# Patient Record
Sex: Male | Born: 1980 | Race: White | Hispanic: No | Marital: Single | Smoking: Current every day smoker
Health system: Southern US, Community
[De-identification: ages and names within clinical notes are randomized; demographics above are authoritative.]

## PROBLEM LIST (undated history)

## (undated) HISTORY — PX: OTHER SURGICAL HISTORY: SHX169

---
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
## 2002-04-15 ENCOUNTER — Inpatient Hospital Stay (HOSPITAL_COMMUNITY): Admission: EM | Admit: 2002-04-15 | Discharge: 2002-04-20 | Payer: Self-pay | Admitting: Psychiatry

## 2010-03-03 ENCOUNTER — Emergency Department (HOSPITAL_COMMUNITY): Admission: EM | Admit: 2010-03-03 | Discharge: 2010-03-04 | Payer: Self-pay | Admitting: Emergency Medicine
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
## 2010-12-31 ENCOUNTER — Emergency Department (HOSPITAL_COMMUNITY)
Admission: EM | Admit: 2010-12-31 | Discharge: 2011-01-01 | Disposition: A | Discharge status: Other Acute Inpatient Hospital | Payer: PRIVATE HEALTH INSURANCE | Attending: Emergency Medicine | Admitting: Emergency Medicine

## 2010-12-31 DIAGNOSIS — F411 Generalized anxiety disorder: Secondary | ICD-10-CM | POA: Insufficient documentation

## 2010-12-31 DIAGNOSIS — F1020 Alcohol dependence, uncomplicated: Secondary | ICD-10-CM | POA: Insufficient documentation

## 2010-12-31 LAB — DIFFERENTIAL
Basophils Absolute: 0 10*3/uL (ref 0.0–0.1)
Basophils Relative: 0 % (ref 0–1)
Eosinophils Absolute: 0.1 10*3/uL (ref 0.0–0.7)
Neutro Abs: 10.7 10*3/uL — ABNORMAL HIGH (ref 1.7–7.7)
Neutrophils Relative %: 66 % (ref 43–77)

## 2010-12-31 LAB — CBC
Hemoglobin: 15.5 g/dL (ref 13.0–17.0)
MCH: 31.3 pg (ref 26.0–34.0)
Platelets: 309 10*3/uL (ref 150–400)
RBC: 4.95 MIL/uL (ref 4.22–5.81)

## 2010-12-31 LAB — URINALYSIS, ROUTINE W REFLEX MICROSCOPIC
Glucose, UA: NEGATIVE mg/dL
Ketones, ur: NEGATIVE mg/dL
Leukocytes, UA: NEGATIVE
Nitrite: NEGATIVE
Protein, ur: NEGATIVE mg/dL
pH: 7 (ref 5.0–8.0)

## 2010-12-31 LAB — RAPID URINE DRUG SCREEN, HOSP PERFORMED
Amphetamines: NOT DETECTED
Barbiturates: NOT DETECTED
Benzodiazepines: NOT DETECTED
Cocaine: NOT DETECTED
Tetrahydrocannabinol: POSITIVE — AB

## 2010-12-31 LAB — COMPREHENSIVE METABOLIC PANEL
ALT: 23 U/L (ref 0–53)
AST: 24 U/L (ref 0–37)
Albumin: 4.7 g/dL (ref 3.5–5.2)
CO2: 28 mEq/L (ref 19–32)
Calcium: 10 mg/dL (ref 8.4–10.5)
Chloride: 103 mEq/L (ref 96–112)
Creatinine, Ser: 0.69 mg/dL (ref 0.50–1.35)
Sodium: 141 mEq/L (ref 135–145)

## 2011-01-01 ENCOUNTER — Inpatient Hospital Stay (HOSPITAL_COMMUNITY)
Admission: RE | Admit: 2011-01-01 | Discharge: 2011-01-04 | DRG: 897 | Disposition: A | Discharge status: Home / Self Care | Payer: PRIVATE HEALTH INSURANCE | Attending: Psychiatry | Admitting: Psychiatry

## 2011-01-01 DIAGNOSIS — F3289 Other specified depressive episodes: Secondary | ICD-10-CM

## 2011-01-01 DIAGNOSIS — F329 Major depressive disorder, single episode, unspecified: Secondary | ICD-10-CM

## 2011-01-01 DIAGNOSIS — F1020 Alcohol dependence, uncomplicated: Secondary | ICD-10-CM

## 2011-01-01 DIAGNOSIS — F17200 Nicotine dependence, unspecified, uncomplicated: Secondary | ICD-10-CM

## 2011-01-01 DIAGNOSIS — Z9181 History of falling: Secondary | ICD-10-CM

## 2011-01-01 DIAGNOSIS — F411 Generalized anxiety disorder: Secondary | ICD-10-CM

## 2011-01-01 LAB — ETHANOL: Alcohol, Ethyl (B): <11 mg/dL (ref 0–11)
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
## 2011-04-09 ENCOUNTER — Emergency Department (HOSPITAL_COMMUNITY)
Admission: EM | Admit: 2011-04-09 | Discharge: 2011-04-09 | Disposition: A | Discharge status: Home / Self Care | Payer: Self-pay | Attending: Emergency Medicine | Admitting: Emergency Medicine

## 2011-04-09 ENCOUNTER — Encounter: Payer: Self-pay | Admitting: *Deleted

## 2011-04-09 DIAGNOSIS — IMO0002 Reserved for concepts with insufficient information to code with codable children: Secondary | ICD-10-CM | POA: Insufficient documentation

## 2011-04-09 DIAGNOSIS — Y92009 Unspecified place in unspecified non-institutional (private) residence as the place of occurrence of the external cause: Secondary | ICD-10-CM | POA: Insufficient documentation

## 2011-04-09 DIAGNOSIS — S0081XA Abrasion of other part of head, initial encounter: Secondary | ICD-10-CM

## 2011-04-09 DIAGNOSIS — W208XXA Other cause of strike by thrown, projected or falling object, initial encounter: Secondary | ICD-10-CM | POA: Insufficient documentation

## 2011-04-09 MED ORDER — ACETAMINOPHEN 325 MG PO TABS
650.0000 mg | ORAL_TABLET | Freq: Once | ORAL | Status: AC
  Administered 2011-04-09: 650 mg via ORAL
  Filled 2011-04-09: qty 2
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
## 2011-04-09 NOTE — ED Provider Notes (Signed)
History     CSN: [id number] Arrival date & time: 04/09/2011  4:21 PM   First MD Initiated Contact with Patient 04/09/11 1644      Chief Complaint  Patient presents with  . Facial Laceration    (Consider location/radiation/quality/duration/timing/severity/associated sxs/prior treatment) Patient is a 30 y.o. male presenting with scalp laceration. The history is provided by the patient.  Head Laceration This is a new problem. The current episode started today.  pt presents to the ED for having had a car stereo fall on him after it fell out of his closet. He states that it does not hurt that badly,however he is concerned about the germs and wants to make sure it is cleaned well. He denies and headaches. Change in vision, severe pain or imbalance.  History reviewed. No pertinent past medical history.  Past Surgical History  Procedure Date  . Arm surgery     History reviewed. No pertinent family history.  History  Substance Use Topics  . Smoking status: Current Everyday Smoker -- 1.0 packs/day for 5 years    Types: Cigarettes  . Smokeless tobacco: Not on file  . Alcohol Use: No      Review of Systems  All other systems reviewed and are negative.    Allergies  Review of patient's allergies indicates no known allergies.  Home Medications  No current outpatient prescriptions on file.  BP 131/82  Pulse 82  Temp(Src) 98.2 F (36.8 C) (Oral)  Resp 20  Ht 5' 10.5" (1.791 m)  Wt 145 lb (65.772 kg)  BMI 20.51 kg/m2  SpO2 99%  Physical Exam  Vitals reviewed. Constitutional: He is oriented to person, place, and time. He appears well-developed and well-nourished.  HENT:  Head: Normocephalic.    Eyes: Conjunctivae are normal. Pupils are equal, round, and reactive to light.  Neck: Normal range of motion. Neck supple.  Cardiovascular: Normal rate and regular rhythm.   Pulmonary/Chest: Effort normal and breath sounds normal.  Musculoskeletal: Normal range of motion.    Neurological: He is alert and oriented to person, place, and time.  Skin: Skin is warm and dry.    ED Course  LACERATION REPAIR Date/Time: 04/09/2011 4:55 PM Performed by: Dorthula Matas Authorized by: Dorthula Matas Consent: Verbal consent obtained. Written consent obtained. Risks and benefits: risks, benefits and alternatives were discussed Patient understanding: patient states understanding of the procedure being performed Patient consent: the patient's understanding of the procedure matches consent given Body area: head/neck Laceration length: 1 cm Foreign bodies: metal Tendon involvement: superficial Nerve involvement: superficial Vascular damage: no Patient sedated: no Amount of cleaning: extensive Debridement: none Degree of undermining: none Skin closure: glue Patient tolerance: Patient tolerated the procedure well with no immediate complications.   (including critical care time)  Labs Reviewed - No data to display No results found.   No diagnosis found.    MDM  No EKG done       Dorthula Matas, [place] 04/10/11 [phone]

## 2011-04-10 NOTE — ED Provider Notes (Signed)
Medical screening examination/treatment/procedure(s) were performed by non-physician practitioner and as supervising physician I was immediately available for consultation/collaboration.   Dayton Bailiff, MD 04/10/11 [phone]

## 2012-01-04 ENCOUNTER — Encounter (HOSPITAL_COMMUNITY): Payer: Self-pay | Admitting: Emergency Medicine

## 2012-01-04 ENCOUNTER — Emergency Department (HOSPITAL_COMMUNITY)
Admission: EM | Admit: 2012-01-04 | Discharge: 2012-01-05 | Disposition: A | Discharge status: Home / Self Care | Payer: Self-pay | Attending: Emergency Medicine | Admitting: Emergency Medicine

## 2012-01-04 DIAGNOSIS — W260XXA Contact with knife, initial encounter: Secondary | ICD-10-CM | POA: Insufficient documentation

## 2012-01-04 DIAGNOSIS — F17200 Nicotine dependence, unspecified, uncomplicated: Secondary | ICD-10-CM | POA: Insufficient documentation

## 2012-01-04 DIAGNOSIS — W261XXA Contact with sword or dagger, initial encounter: Secondary | ICD-10-CM | POA: Insufficient documentation

## 2012-01-04 DIAGNOSIS — S61216A Laceration without foreign body of right little finger without damage to nail, initial encounter: Secondary | ICD-10-CM

## 2012-01-04 DIAGNOSIS — S61209A Unspecified open wound of unspecified finger without damage to nail, initial encounter: Secondary | ICD-10-CM | POA: Insufficient documentation

## 2012-01-04 NOTE — ED Notes (Signed)
Pt alert, arrives from home, c/o laceration to right finger, onset was this evening, DSD in place, resp even unlabored, skin pwd

## 2012-01-05 MED ORDER — TETANUS-DIPHTH-ACELL PERTUSSIS 5-2.5-18.5 LF-MCG/0.5 IM SUSP: 0.5000 mL | Freq: Once | INTRAMUSCULAR | Status: DC

## 2012-01-05 NOTE — ED Notes (Signed)
MD at bedside. With dermabound  For laceration

## 2012-01-05 NOTE — ED Provider Notes (Signed)
History     CSN: [id number]  Arrival date & time 01/04/12  2306   First MD Initiated Contact with Patient 01/05/12 0053      Chief Complaint  Patient presents with  . Laceration    (Consider location/radiation/quality/duration/timing/severity/associated sxs/prior treatment) Patient is a 31 y.o. male presenting with skin laceration. The history is provided by the patient.  Laceration  The incident occurred 3 to 5 hours ago. The laceration is located on the left hand. The laceration is 1 cm in size. The laceration mechanism was a a clean knife. The pain is moderate. The pain has been constant since onset. He reports no foreign bodies present. His tetanus status is unknown.    History reviewed. No pertinent past medical history.  Past Surgical History  Procedure Date  . Arm surgery     No family history on file.  History  Substance Use Topics  . Smoking status: Current Everyday Smoker -- 1.0 packs/day for 5 years    Types: Cigarettes  . Smokeless tobacco: Not on file  . Alcohol Use: No      Review of Systems  Constitutional: Negative.   Musculoskeletal: Negative.   Skin: Positive for wound.  Neurological: Negative for numbness.    Allergies  Review of patient's allergies indicates no known allergies.  Home Medications  No current outpatient prescriptions on file.  BP 131/53  Pulse 85  Temp 98.7 F (37.1 C) (Oral)  Resp 16  Ht 5\' 10"  (1.778 m)  Wt 142 lb 3.2 oz (64.501 kg)  BMI 20.40 kg/m2  SpO2 99%  Physical Exam  Nursing note and vitals reviewed. Constitutional: He appears well-developed and well-nourished. No distress.  HENT:  Head: Normocephalic and atraumatic.  Neck: Normal range of motion.  Cardiovascular: Normal rate.   Pulmonary/Chest: Effort normal.  Musculoskeletal: Normal range of motion.  Neurological: He is alert.  Skin: Skin is warm and dry. He is not diaphoretic.       <1cm lac to R 5th finger middle phalanx palmar surface, bleeding  controlled, clean appearing, pt able to flex/ext at DIP/PIP/MCP without difficulty  Psychiatric: He has a normal mood and affect.    ED Course  Procedures (including critical care time) LACERATION REPAIR Performed by: Grant Fontana Authorized by: Grant Fontana Consent: Verbal consent obtained. Risks and benefits: risks, benefits and alternatives were discussed Consent given by: patient Patient identity confirmed: provided demographic data Prepped and Draped in normal sterile fashion Wound explored  Laceration Location: R 5th finger  Laceration Length: 1cm  No Foreign Bodies seen or palpated  Irrigation method: syringe Amount of cleaning: standard  Skin closure: dermabond  Patient tolerance: Patient tolerated the procedure well with no immediate complications.  Labs Reviewed - No data to display No results found.   1. Laceration of fifth finger of right hand       MDM  Pt presents with lac to finger. Wound clean appearing. Wound cleaned, dermabond applied. Wound care discussed. Reasons to return given.       Grant Fontana, PA-C 01/05/12 0220  Grant Fontana, PA-C 01/05/12 6213

## 2012-01-07 NOTE — ED Provider Notes (Signed)
Medical screening examination/treatment/procedure(s) were performed by non-physician practitioner and as supervising physician I was immediately available for consultation/collaboration.  Andie Mortimer R. Jaydian Santana, MD 01/07/12 0729 

## 2014-02-16 ENCOUNTER — Encounter (HOSPITAL_COMMUNITY): Payer: Self-pay | Admitting: Emergency Medicine

## 2014-02-16 ENCOUNTER — Emergency Department (HOSPITAL_COMMUNITY): Payer: BC Managed Care – PPO

## 2014-02-16 ENCOUNTER — Emergency Department (HOSPITAL_COMMUNITY)
Admission: EM | Admit: 2014-02-16 | Discharge: 2014-02-16 | Disposition: A | Discharge status: Home / Self Care | Payer: BC Managed Care – PPO | Attending: Emergency Medicine | Admitting: Emergency Medicine

## 2014-02-16 DIAGNOSIS — F17200 Nicotine dependence, unspecified, uncomplicated: Secondary | ICD-10-CM | POA: Diagnosis not present

## 2014-02-16 DIAGNOSIS — R002 Palpitations: Secondary | ICD-10-CM | POA: Diagnosis present

## 2014-02-16 DIAGNOSIS — F13230 Sedative, hypnotic or anxiolytic dependence with withdrawal, uncomplicated: Secondary | ICD-10-CM

## 2014-02-16 DIAGNOSIS — F19939 Other psychoactive substance use, unspecified with withdrawal, unspecified: Secondary | ICD-10-CM | POA: Diagnosis not present

## 2014-02-16 DIAGNOSIS — F1810 Inhalant abuse, uncomplicated: Secondary | ICD-10-CM

## 2014-02-16 LAB — COMPREHENSIVE METABOLIC PANEL
ALT: 24 U/L (ref 0–53)
ANION GAP: 15 (ref 5–15)
AST: 18 U/L (ref 0–37)
Albumin: 4.4 g/dL (ref 3.5–5.2)
Alkaline Phosphatase: 81 U/L (ref 39–117)
BILIRUBIN TOTAL: 0.4 mg/dL (ref 0.3–1.2)
BUN: 13 mg/dL (ref 6–23)
CHLORIDE: 97 meq/L (ref 96–112)
CO2: 23 meq/L (ref 19–32)
CREATININE: 0.76 mg/dL (ref 0.50–1.35)
Calcium: 9.1 mg/dL (ref 8.4–10.5)
GFR calc Af Amer: >90 mL/min (ref >90)
Glucose, Bld: 94 mg/dL (ref 70–99)
Potassium: 4.1 mEq/L (ref 3.7–5.3)
Sodium: 135 mEq/L — ABNORMAL LOW (ref 137–147)
Total Protein: 7.8 g/dL (ref 6.0–8.3)

## 2014-02-16 LAB — CBC
HEMATOCRIT: 39.2 % (ref 39.0–52.0)
HEMOGLOBIN: 14.3 g/dL (ref 13.0–17.0)
MCH: 32.1 pg (ref 26.0–34.0)
MCHC: 36.5 g/dL — ABNORMAL HIGH (ref 30.0–36.0)
MCV: 87.9 fL (ref 78.0–100.0)
Platelets: 258 10*3/uL (ref 150–400)
RBC: 4.46 MIL/uL (ref 4.22–5.81)
RDW: 13.5 % (ref 11.5–15.5)
WBC: 6.1 10*3/uL (ref 4.0–10.5)

## 2014-02-16 LAB — TROPONIN I

## 2014-02-16 LAB — MAGNESIUM: Magnesium: 2.3 mg/dL (ref 1.5–2.5)

## 2014-02-16 LAB — RAPID URINE DRUG SCREEN, HOSP PERFORMED
Amphetamines: NOT DETECTED
BARBITURATES: NOT DETECTED
BENZODIAZEPINES: NOT DETECTED
Cocaine: NOT DETECTED
Opiates: NOT DETECTED
TETRAHYDROCANNABINOL: NOT DETECTED

## 2014-02-16 LAB — SALICYLATE LEVEL: Salicylate Lvl: <2 mg/dL — ABNORMAL LOW (ref 2.8–20.0)

## 2014-02-16 LAB — ETHANOL

## 2014-02-16 MED ORDER — SODIUM CHLORIDE 0.9 % IV BOLUS (SEPSIS)
1000.0000 mL | Freq: Once | INTRAVENOUS | Status: AC
  Administered 2014-02-16: 1000 mL via INTRAVENOUS

## 2014-02-16 MED ORDER — LORAZEPAM 1 MG PO TABS: 1.0000 mg | ORAL_TABLET | Freq: Three times a day (TID) | ORAL | Status: AC | PRN

## 2014-02-16 MED ORDER — LORAZEPAM 2 MG/ML IJ SOLN
1.0000 mg | Freq: Once | INTRAMUSCULAR | Status: AC
  Administered 2014-02-16: 1 mg via INTRAVENOUS
  Filled 2014-02-16: qty 1

## 2014-02-16 NOTE — ED Provider Notes (Signed)
CSN: [id number]     Arrival date & time 02/16/14  1353 History   First MD Initiated Contact with Patient 02/16/14 1422     Chief Complaint  Patient presents with  . Palpitations  . whip it- drug use      (Consider location/radiation/quality/duration/timing/severity/associated sxs/prior Treatment) HPI Patient reports he started using alcohol marijuana at age 33. 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 He states he's been to detox twice, last time was last year for alcohol. He did not remain sober immediately after that however he has been sober from alcohol for the past 3-1/2 months. He reports he has been abusing whip its for about 8 months. However he states he has not done them for several days. He states today he was smoking a cigarette and had acute onset of feeling like his heart was racing. He states this started about 2 PM. He complains of chest pressure and some shortness of breath. He denies nausea or vomiting. He states he's had similar symptoms in the past when he used to chain smoke. He states he took a half of a Valium prior to arrival without improvement. Patient states he feels like he is depressed however he denies suicidal ideation or homicidal ideation.  Family history is negative coronary artery disease.  PCP Dr Freda Jackson Psychologist Dr Athena Masse just recently started seeing  History reviewed. No pertinent past medical history. Past Surgical History  Procedure Laterality Date  . Arm surgery     History reviewed. No pertinent family history. History  Substance Use Topics  . Smoking status: Current Every Day Smoker -- 1.00 packs/day for 5 years    Types: Cigarettes  . Smokeless tobacco: Not on file  . Alcohol Use: No  sells items on [employer] Lives with mother Smokes 5-8 cigs a day  Review of Systems  All other systems reviewed and are negative.     Allergies  Review of patient's allergies indicates no known  allergies.  Home Medications   Prior to Admission medications   Medication Sig Start Date End Date Taking? Authorizing Provider  diazepam (VALIUM) 10 MG tablet Take 5 mg by mouth at bedtime as needed for anxiety.   Yes Historical Provider, MD   BP 127/91  Pulse 109  Temp(Src) 98.6 F (37 C) (Oral)  Resp 16  SpO2 99%  Vital signs normal except tachycardia  Physical Exam  Nursing note and vitals reviewed. Constitutional: He is oriented to person, place, and time. He appears well-developed and well-nourished.  Non-toxic appearance. He does not appear ill. No distress.  HENT:  Head: Normocephalic and atraumatic.  Right Ear: External ear normal.  Left Ear: External ear normal.  Nose: Nose normal. No mucosal edema or rhinorrhea.  Mouth/Throat: Oropharynx is clear and moist and mucous membranes are normal. No dental abscesses or uvula swelling.  Eyes: Conjunctivae and EOM are normal. Pupils are equal, round, and reactive to light.  Conjunctival injection especially around the limbus (states he kept his contacts in for several days)  Neck: Normal range of motion and full passive range of motion without pain. Neck supple.  Cardiovascular: Normal rate, regular rhythm and normal heart sounds.  Exam reveals no gallop and no friction rub.   No murmur heard. Pulmonary/Chest: Effort normal and breath sounds normal. No respiratory distress. He has no wheezes. He has no rhonchi. He has no rales. He exhibits no tenderness and no crepitus.  Abdominal: Soft. Normal appearance and bowel sounds  are normal. He exhibits no distension. There is no tenderness. There is no rebound and no guarding.  Musculoskeletal: Normal range of motion. He exhibits no edema and no tenderness.  Moves all extremities well.   Neurological: He is alert and oriented to person, place, and time. He has normal strength. No cranial nerve deficit.  Has tremor  Skin: Skin is warm, dry and intact. No rash noted. No erythema. No  pallor.  Psychiatric: He has a normal mood and affect. His speech is normal and behavior is normal. His mood appears not anxious.    ED Course  Procedures (including critical care time)  Medications  sodium chloride 0.9 % bolus 1,000 mL (1,000 mLs Intravenous New Bag/Given 02/16/14 1705)  LORazepam (ATIVAN) injection 1 mg (1 mg Intravenous Given 02/16/14 1705)    The patient is feeling better after getting the IV fluids and Ativan. His tremor is gone. His blood pressure and pulse are normal.   20:33The patient was discussed with poison control, they recommend just symptomatic treatment as an outpatient.    Labs Review Results for orders placed during the hospital encounter of 02/16/14  CBC      Result Value Ref Range   WBC 6.1  4.0 - 10.5 K/uL   RBC 4.46  4.22 - 5.81 MIL/uL   Hemoglobin 14.3  13.0 - 17.0 g/dL   HCT 16.1  09.6 - 04.5 %   MCV 87.9  78.0 - 100.0 fL   MCH 32.1  26.0 - 34.0 pg   MCHC 36.5 (*) 30.0 - 36.0 g/dL   RDW 40.9  81.1 - 91.4 %   Platelets 258  150 - 400 K/uL  COMPREHENSIVE METABOLIC PANEL      Result Value Ref Range   Sodium 135 (*) 137 - 147 mEq/L   Potassium 4.1  3.7 - 5.3 mEq/L   Chloride 97  96 - 112 mEq/L   CO2 23  19 - 32 mEq/L   Glucose, Bld 94  70 - 99 mg/dL   BUN 13  6 - 23 mg/dL   Creatinine, Ser 7.82  0.50 - 1.35 mg/dL   Calcium 9.1  8.4 - 95.6 mg/dL   Total Protein 7.8  6.0 - 8.3 g/dL   Albumin 4.4  3.5 - 5.2 g/dL   AST 18  0 - 37 U/L   ALT 24  0 - 53 U/L   Alkaline Phosphatase 81  39 - 117 U/L   Total Bilirubin 0.4  0.3 - 1.2 mg/dL   GFR calc non Af Amer >90  >90 mL/min   GFR calc Af Amer >90  >90 mL/min   Anion gap 15  5 - 15  ETHANOL      Result Value Ref Range   Alcohol, Ethyl (B) <11  0 - 11 mg/dL  SALICYLATE LEVEL      Result Value Ref Range   Salicylate Lvl <2.0 (*) 2.8 - 20.0 mg/dL  URINE RAPID DRUG SCREEN (HOSP PERFORMED)      Result Value Ref Range   Opiates NONE DETECTED  NONE DETECTED   Cocaine NONE DETECTED  NONE  DETECTED   Benzodiazepines NONE DETECTED  NONE DETECTED   Amphetamines NONE DETECTED  NONE DETECTED   Tetrahydrocannabinol NONE DETECTED  NONE DETECTED   Barbiturates NONE DETECTED  NONE DETECTED  TROPONIN I      Result Value Ref Range   Troponin I <0.30  <0.30 ng/mL  MAGNESIUM      Result Value Ref  Range   Magnesium 2.3  1.5 - 2.5 mg/dL   Laboratory interpretation all normal     Imaging Review Dg Chest 2 View  02/16/2014   CLINICAL DATA:  Palpitations  EXAM: CHEST  2 VIEW  COMPARISON:  None.  FINDINGS: Normal mediastinum and cardiac silhouette. Normal pulmonary vasculature. No evidence of effusion, infiltrate, or pneumothorax. No acute bony abnormality.  IMPRESSION: No acute cardiopulmonary process.   Electronically Signed   By: Genevive Bi M.D.   On: 02/16/2014 18:54     EKG Interpretation   Date/Time:  Wednesday February 16 2014 13:46:39 EDT Ventricular Rate:  113 PR Interval:  162 QRS Duration: 82 QT Interval:  312 QTC Calculation: 427 R Axis:   67 Text Interpretation:  Sinus tachycardia Otherwise normal ECG No old  tracing to compare Confirmed by Quierra Silverio  MD-I, Rupert Azzara ([id number]) on 02/16/2014  4:33:10 PM      MDM   Final diagnoses:  Palpitations  Huffing  Inhalant withdrawal, uncomplicated    New Prescriptions   LORAZEPAM (ATIVAN) 1 MG TABLET    Take 1 tablet (1 mg total) by mouth 3 (three) times daily as needed for anxiety.    Plan discharge  Devoria Albe, MD, Franz Dell, MD 02/16/14 [phone]

## 2014-02-16 NOTE — ED Notes (Signed)
rn talked with pt, he is refusing blood work, just wanted to make sure "his heart isn't going to explode"

## 2014-02-16 NOTE — ED Notes (Signed)
Pt reports his heart has been racing. Reports he stopped doing whit its, nitrous oxide 2 days ago. Facial redness. Reports does whip its up to 50x per day. Pt denies pain.

## 2014-02-16 NOTE — ED Notes (Signed)
Pt refused labs. Triage RN aware.

## 2014-02-16 NOTE — ED Notes (Signed)
I ask patient for a urine sample and he stated he is unable to give urine sample at this time

## 2014-02-16 NOTE — Discharge Instructions (Signed)
I gave you a list of places to go to get evaluated for your depression and substance abuse. Drink plenty of fluids. Take the ativan for your withdrawal symptoms from the huffing (shakiness, heart skipping). Return to the ED if you feel worse or if you feel you will hurt yourself or someone else.

## 2015-10-26 IMAGING — CR DG CHEST 2V
2 series · 2 of 2 positions shown · non-contrast
Comparison: None.

CLINICAL DATA: Palpitations

EXAM:
CHEST  2 VIEW

[w chest pa]
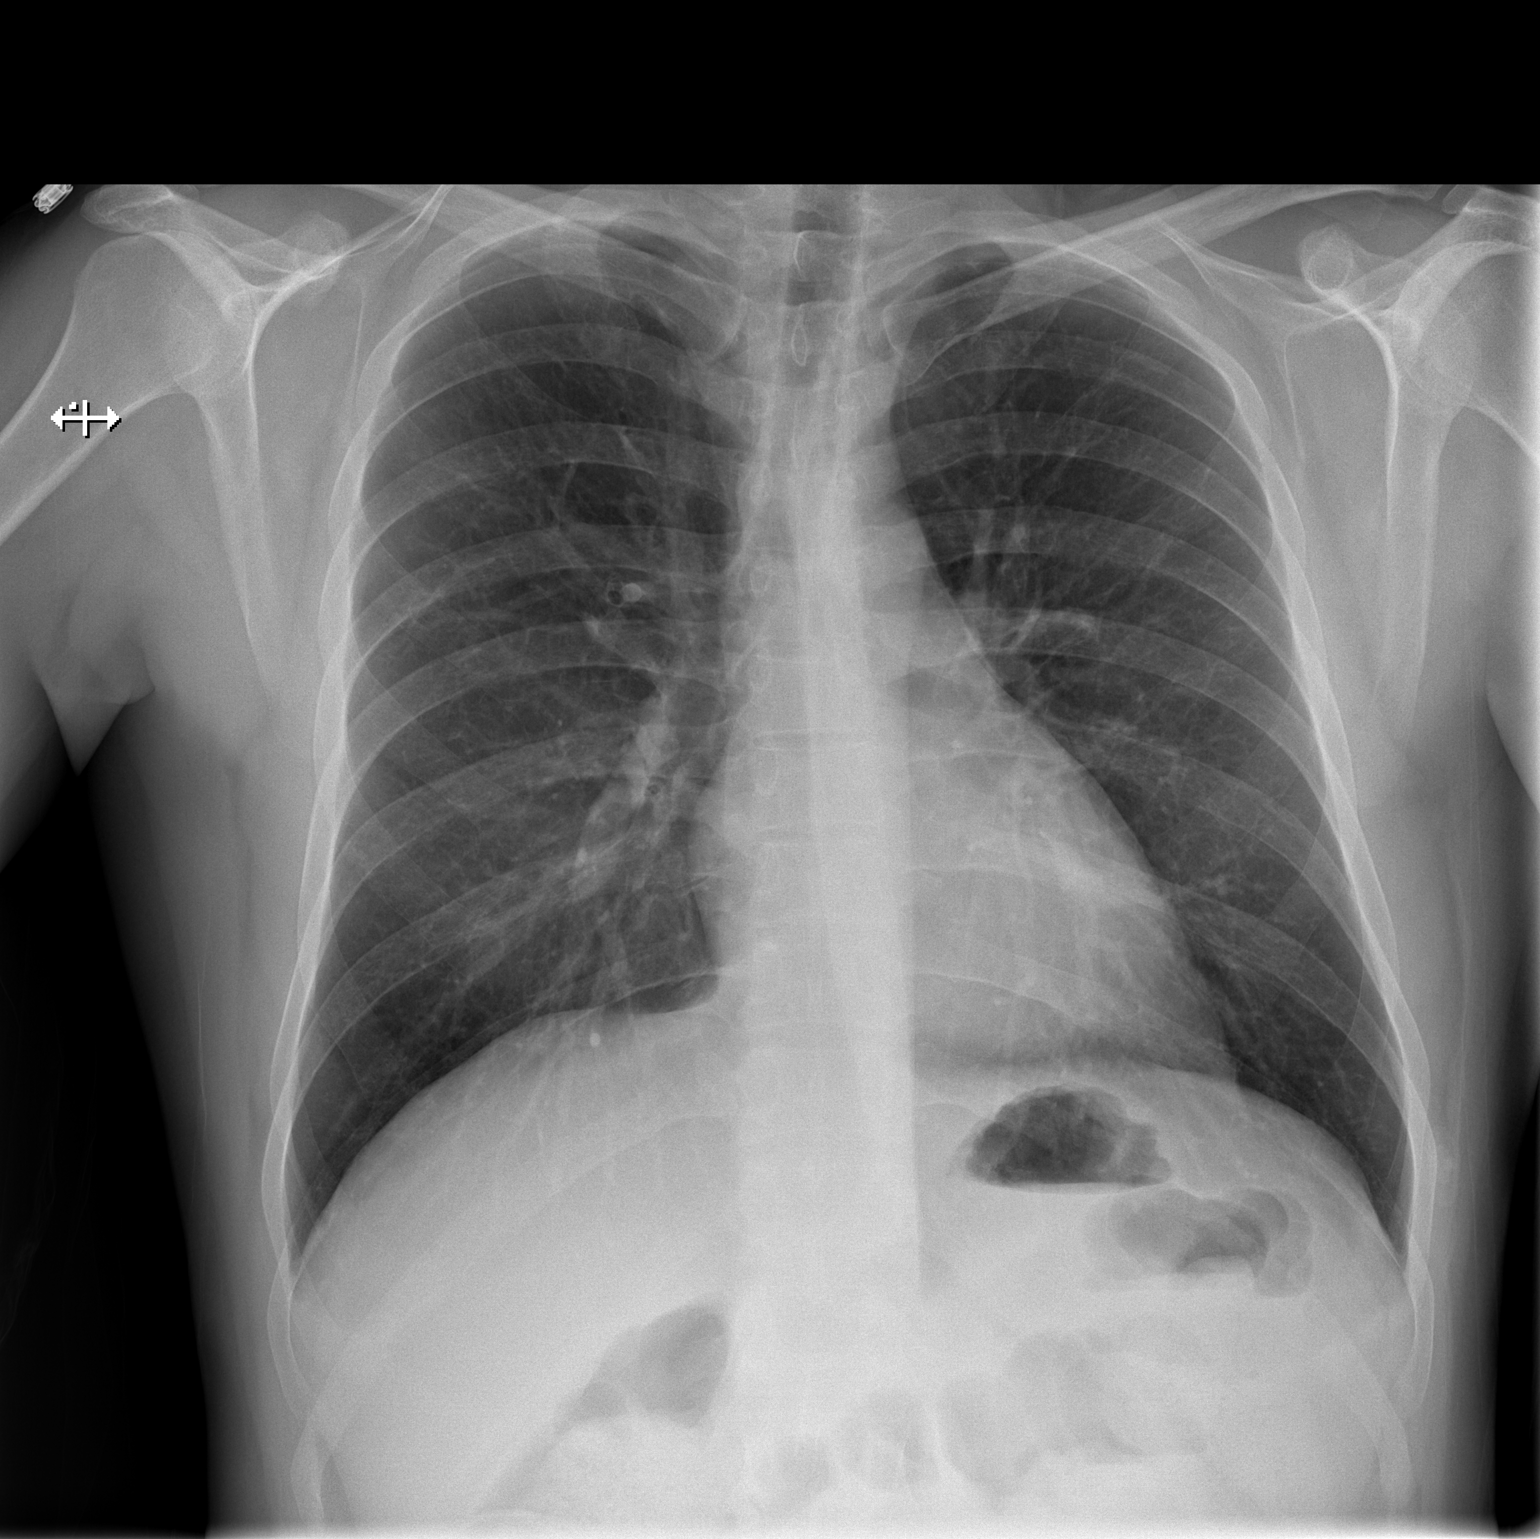

[w chest lat]
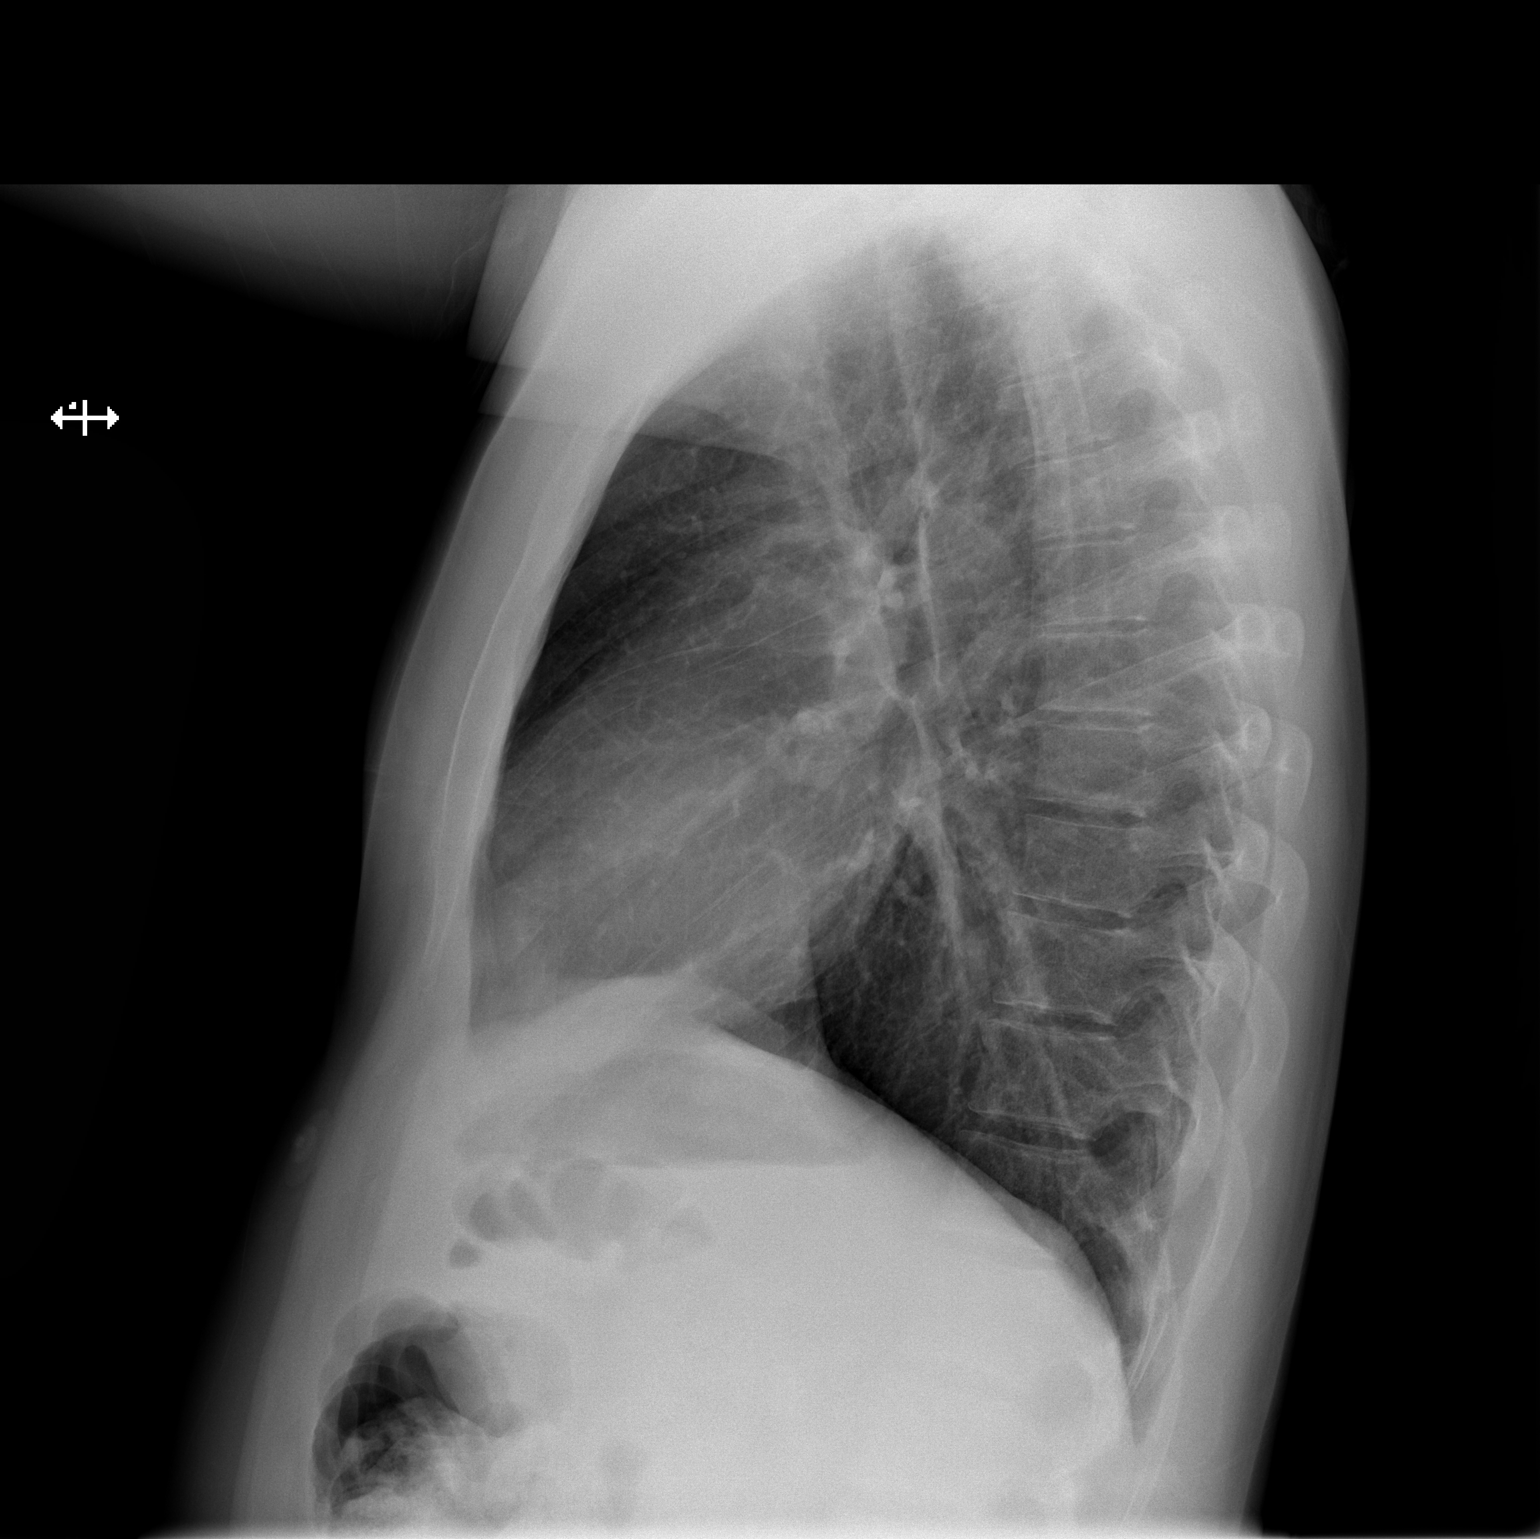

[2 of 2 positions shown; findings below may reference images not displayed]

FINDINGS: Normal mediastinum and cardiac silhouette. Normal pulmonary
vasculature. No evidence of effusion, infiltrate, or pneumothorax.
No acute bony abnormality.
IMPRESSION: No acute cardiopulmonary process.

## 2016-02-22 ENCOUNTER — Other Ambulatory Visit: Payer: Self-pay | Admitting: Sports Medicine

## 2016-02-22 ENCOUNTER — Ambulatory Visit
Admission: RE | Admit: 2016-02-22 | Discharge: 2016-02-22 | Disposition: A | Discharge status: Home / Self Care | Payer: BLUE CROSS/BLUE SHIELD | Source: Ambulatory Visit | Attending: Sports Medicine | Admitting: Sports Medicine

## 2016-02-22 DIAGNOSIS — S92322A Displaced fracture of second metatarsal bone, left foot, initial encounter for closed fracture: Secondary | ICD-10-CM

## 2017-10-31 IMAGING — CT CT FOOT*L* W/O CM
3 of 5 series · 7 of 20 positions shown, 8 images · non-contrast
Comparison: None.

CLINICAL DATA: Evaluate midfoot fractures.  Fell [DATE] weeks ago.

EXAM:
CT OF THE LEFT FOOT WITHOUT CONTRAST
TECHNIQUE: Multidetector CT imaging of the left foot was performed according to
the standard protocol. Multiplanar CT image reconstructions were
also generated.

[Series 4: soft tissue lower extremity · axial · 0.53mm/px · z∈[-164,-116]mm · 2 of 73 slices shown]
[im 25/73  soft-tissue]
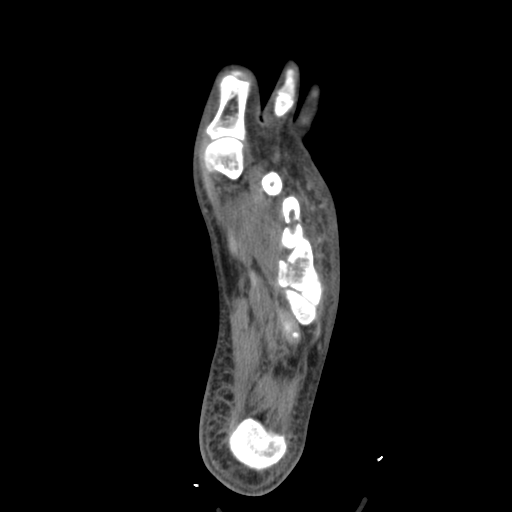
[im 49/73  soft-tissue]
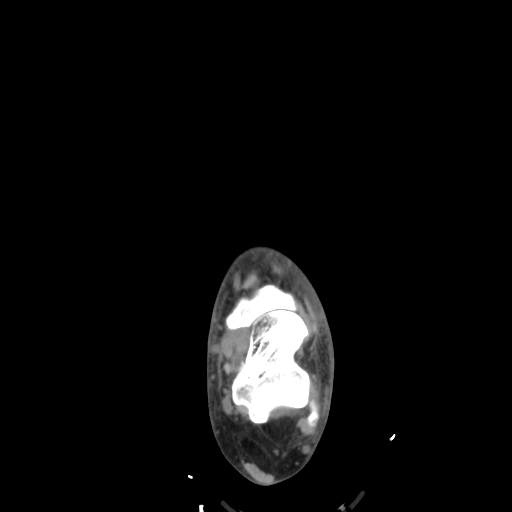

[Series 603: axial bone · coronal · 0.53mm/px · 3 of 114 slices shown]
[im 27/114  bone]
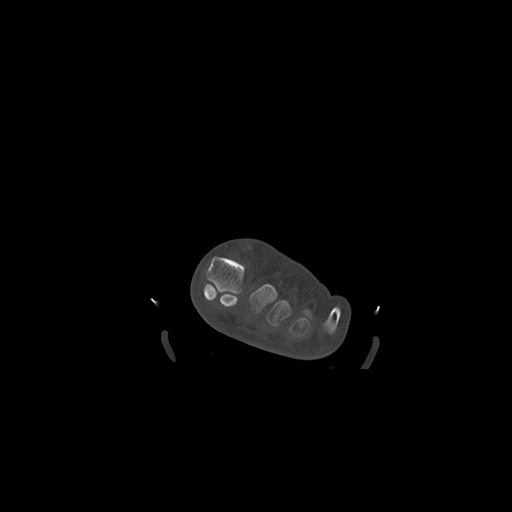
[im 47/114  bone]
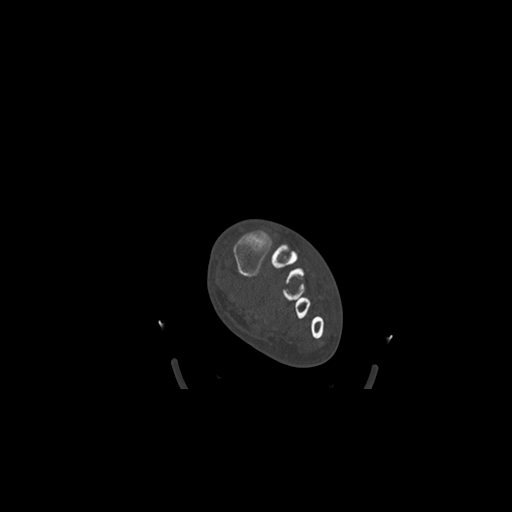
[im 67/114  bone]
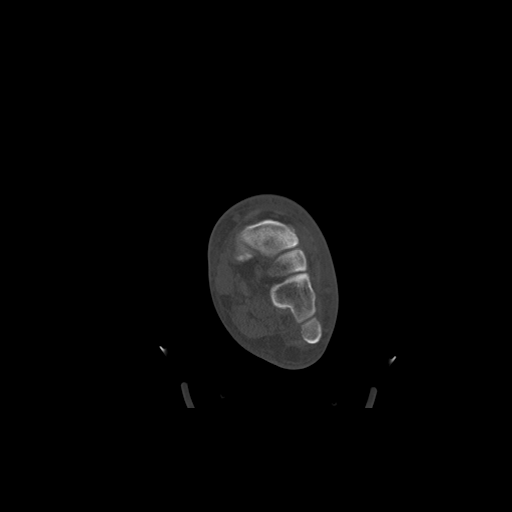

[Series 605: cor soft · axial · 0.53mm/px · z∈[-179,-144]mm · 2 of 63 slices shown, 3 images]
[im 21/63  soft-tissue]
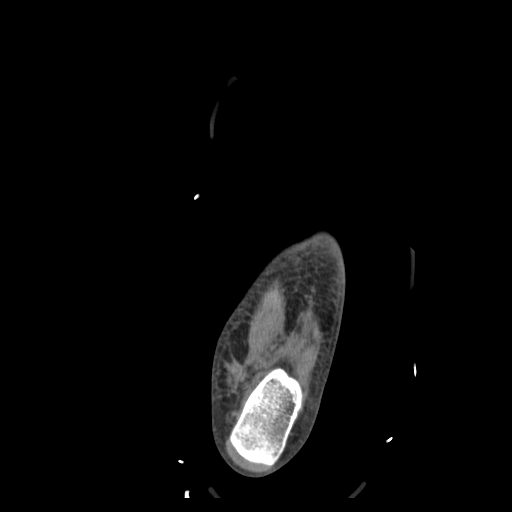
[im 21/63  bone]
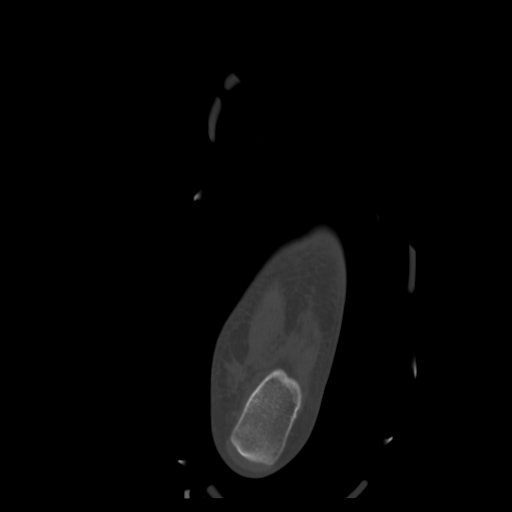
[im 42/63  bone]
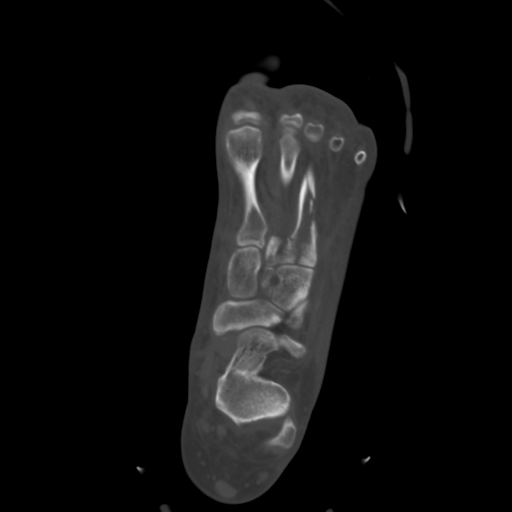

[7 of 20 positions shown; findings below may reference images not displayed]

FINDINGS: Bones/Joint/Cartilage

There are comminuted and displaced fractures of the second and third
metatarsals proximally. There are intra-articular components also.
There is also a fracture involving the base of the fourth metatarsal
with an intra-articular component.

The first and fifth metatarsals are intact.

No definite fractures of the cuneiform is for cuboid. The tarsal
metatarsal joints are maintained. No obvious Lisfranc injury.

The talus and calcaneus are intact.

Nondisplaced fracture of the distal fibula is noted. No distal tibia
fracture. The ankle mortise is maintained. The subtalar joints are
maintained.

Ligaments

Suboptimally assessed by CT.

Muscles and Tendons

No significant findings.

Soft tissues

Diffuse subcutaneous soft tissue swelling/ edema/fluid.
IMPRESSION: 1. Comminuted and displaced fractures involving the second, third
and fourth metatarsals proximally. The first and fifth metatarsals
are intact.
2. No definite tarsal fractures. The tarsal metatarsal joints are
maintained.
3. Nondisplaced fracture of the distal fibula.
